# Patient Record
Sex: Male | Born: 1990 | Hispanic: Yes | Marital: Single | Smoking: Former smoker
Health system: Southern US, Community
[De-identification: ages and names within clinical notes are randomized; demographics above are authoritative.]

## PROBLEM LIST (undated history)

## (undated) DIAGNOSIS — I10 Essential (primary) hypertension: Secondary | ICD-10-CM

---

## 2015-02-08 ENCOUNTER — Emergency Department (HOSPITAL_COMMUNITY): Payer: Self-pay

## 2015-02-08 ENCOUNTER — Emergency Department (HOSPITAL_COMMUNITY)
Admission: EM | Admit: 2015-02-08 | Discharge: 2015-02-08 | Disposition: A | Discharge status: Home / Self Care | Payer: Self-pay | Attending: Emergency Medicine | Admitting: Emergency Medicine

## 2015-02-08 ENCOUNTER — Encounter (HOSPITAL_COMMUNITY): Payer: Self-pay | Admitting: Emergency Medicine

## 2015-02-08 DIAGNOSIS — H1132 Conjunctival hemorrhage, left eye: Secondary | ICD-10-CM | POA: Insufficient documentation

## 2015-02-08 DIAGNOSIS — S199XXA Unspecified injury of neck, initial encounter: Secondary | ICD-10-CM | POA: Insufficient documentation

## 2015-02-08 DIAGNOSIS — Y9389 Activity, other specified: Secondary | ICD-10-CM | POA: Insufficient documentation

## 2015-02-08 DIAGNOSIS — S0232XA Fracture of orbital floor, left side, initial encounter for closed fracture: Secondary | ICD-10-CM | POA: Insufficient documentation

## 2015-02-08 DIAGNOSIS — S20229A Contusion of unspecified back wall of thorax, initial encounter: Secondary | ICD-10-CM | POA: Insufficient documentation

## 2015-02-08 DIAGNOSIS — Y9289 Other specified places as the place of occurrence of the external cause: Secondary | ICD-10-CM | POA: Insufficient documentation

## 2015-02-08 DIAGNOSIS — Z87891 Personal history of nicotine dependence: Secondary | ICD-10-CM | POA: Insufficient documentation

## 2015-02-08 DIAGNOSIS — S20219A Contusion of unspecified front wall of thorax, initial encounter: Secondary | ICD-10-CM | POA: Insufficient documentation

## 2015-02-08 DIAGNOSIS — M542 Cervicalgia: Secondary | ICD-10-CM

## 2015-02-08 DIAGNOSIS — S4992XA Unspecified injury of left shoulder and upper arm, initial encounter: Secondary | ICD-10-CM | POA: Insufficient documentation

## 2015-02-08 DIAGNOSIS — Y998 Other external cause status: Secondary | ICD-10-CM | POA: Insufficient documentation

## 2015-02-08 DIAGNOSIS — S3991XA Unspecified injury of abdomen, initial encounter: Secondary | ICD-10-CM | POA: Insufficient documentation

## 2015-02-08 HISTORY — DX: Essential (primary) hypertension: I10

## 2015-02-08 LAB — CBC WITH DIFFERENTIAL/PLATELET
BASOS PCT: 0 %
Basophils Absolute: 0 10*3/uL (ref 0.0–0.1)
Eosinophils Absolute: 0 10*3/uL (ref 0.0–0.7)
Eosinophils Relative: 0 %
HEMATOCRIT: 41.5 % (ref 39.0–52.0)
Hemoglobin: 15 g/dL (ref 13.0–17.0)
LYMPHS ABS: 2.1 10*3/uL (ref 0.7–4.0)
Lymphocytes Relative: 10 %
MCH: 32.6 pg (ref 26.0–34.0)
MCHC: 36.1 g/dL — AB (ref 30.0–36.0)
MCV: 90.2 fL (ref 78.0–100.0)
MONO ABS: 1.6 10*3/uL — AB (ref 0.1–1.0)
MONOS PCT: 8 %
NEUTROS ABS: 16.2 10*3/uL — AB (ref 1.7–7.7)
Neutrophils Relative %: 82 %
Platelets: 355 10*3/uL (ref 150–400)
RBC: 4.6 MIL/uL (ref 4.22–5.81)
RDW: 11.8 % (ref 11.5–15.5)
WBC: 19.8 10*3/uL — ABNORMAL HIGH (ref 4.0–10.5)

## 2015-02-08 LAB — COMPREHENSIVE METABOLIC PANEL
ALBUMIN: 4.7 g/dL (ref 3.5–5.0)
ALK PHOS: 60 U/L (ref 38–126)
ALT: 20 U/L (ref 17–63)
ANION GAP: 15 (ref 5–15)
AST: 47 U/L — ABNORMAL HIGH (ref 15–41)
BUN: 11 mg/dL (ref 6–20)
CALCIUM: 9.4 mg/dL (ref 8.9–10.3)
CO2: 20 mmol/L — AB (ref 22–32)
Chloride: 105 mmol/L (ref 101–111)
Creatinine, Ser: 1.06 mg/dL (ref 0.61–1.24)
GFR calc Af Amer: >60 mL/min (ref >60)
GFR calc non Af Amer: >60 mL/min (ref >60)
GLUCOSE: 102 mg/dL — AB (ref 65–99)
Potassium: 4.1 mmol/L (ref 3.5–5.1)
SODIUM: 140 mmol/L (ref 135–145)
Total Bilirubin: 1.1 mg/dL (ref 0.3–1.2)
Total Protein: 6.9 g/dL (ref 6.5–8.1)

## 2015-02-08 LAB — ETHANOL: Alcohol, Ethyl (B): <5 mg/dL (ref <5)

## 2015-02-08 LAB — LIPASE, BLOOD: Lipase: 43 U/L (ref 11–51)

## 2015-02-08 LAB — I-STAT CG4 LACTIC ACID, ED: Lactic Acid, Venous: 2.38 mmol/L (ref 0.5–2.0)

## 2015-02-08 MED ORDER — SODIUM CHLORIDE 0.9 % IV SOLN
1000.0000 mL | Freq: Once | INTRAVENOUS | Status: AC
  Administered 2015-02-08: 1000 mL via INTRAVENOUS

## 2015-02-08 MED ORDER — OXYCODONE-ACETAMINOPHEN 5-325 MG PO TABS: 1.0000 | ORAL_TABLET | ORAL | Status: AC | PRN

## 2015-02-08 MED ORDER — MORPHINE SULFATE (PF) 4 MG/ML IV SOLN
4.0000 mg | Freq: Once | INTRAVENOUS | Status: AC
  Administered 2015-02-08: 4 mg via INTRAVENOUS
  Filled 2015-02-08: qty 1

## 2015-02-08 MED ORDER — SODIUM CHLORIDE 0.9 % IV SOLN: 1000.0000 mL | INTRAVENOUS | Status: DC

## 2015-02-08 MED ORDER — ONDANSETRON HCL 4 MG/2ML IJ SOLN
4.0000 mg | Freq: Once | INTRAMUSCULAR | Status: AC
  Administered 2015-02-08: 4 mg via INTRAVENOUS
  Filled 2015-02-08: qty 2

## 2015-02-08 MED ORDER — IOHEXOL 300 MG/ML  SOLN
100.0000 mL | Freq: Once | INTRAMUSCULAR | Status: AC | PRN
Start: 2015-02-08 — End: 2015-02-08
  Administered 2015-02-08: 100 mL via INTRAVENOUS
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
## 2015-02-08 NOTE — ED Notes (Signed)
Pt tearful and upset. [employer] at bedside.
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
